# Patient Record
Sex: Female | Born: 1960 | Race: Asian | Hispanic: No | Marital: Single | Smoking: Never smoker
Health system: Southern US, Community
[De-identification: ages and names within clinical notes are randomized; demographics above are authoritative.]

## PROBLEM LIST (undated history)

## (undated) HISTORY — PX: OVARIAN CYST REMOVAL: SHX89

---

## 2018-11-18 ENCOUNTER — Ambulatory Visit (INDEPENDENT_AMBULATORY_CARE_PROVIDER_SITE_OTHER): Payer: BLUE CROSS/BLUE SHIELD

## 2018-11-18 ENCOUNTER — Ambulatory Visit
Admission: EM | Admit: 2018-11-18 | Discharge: 2018-11-18 | Disposition: A | Discharge status: Home / Self Care | Payer: BLUE CROSS/BLUE SHIELD | Attending: Family Medicine | Admitting: Family Medicine

## 2018-11-18 ENCOUNTER — Other Ambulatory Visit: Payer: Self-pay

## 2018-11-18 ENCOUNTER — Ambulatory Visit: Payer: BLUE CROSS/BLUE SHIELD

## 2018-11-18 ENCOUNTER — Encounter: Payer: Self-pay | Admitting: Emergency Medicine

## 2018-11-18 DIAGNOSIS — S0083XA Contusion of other part of head, initial encounter: Secondary | ICD-10-CM | POA: Diagnosis not present

## 2018-11-18 DIAGNOSIS — S46811A Strain of other muscles, fascia and tendons at shoulder and upper arm level, right arm, initial encounter: Secondary | ICD-10-CM

## 2018-11-18 DIAGNOSIS — S93401A Sprain of unspecified ligament of right ankle, initial encounter: Secondary | ICD-10-CM | POA: Diagnosis not present

## 2018-11-18 DIAGNOSIS — W0110XA Fall on same level from slipping, tripping and stumbling with subsequent striking against unspecified object, initial encounter: Secondary | ICD-10-CM | POA: Diagnosis not present

## 2018-11-18 MED ORDER — LIDOCAINE 5 % EX PTCH: 1.0000 | MEDICATED_PATCH | CUTANEOUS | 0 refills | Status: AC

## 2018-11-18 NOTE — ED Provider Notes (Signed)
[facility] ____________________________________________  Time seen: Approximately 4:29 PM  I have reviewed the triage vital signs and the nursing notes.   HISTORY  Chief Complaint Fall; Facial Pain; and Ankle Pain   HPI Vicki Riley is a 58 y.o. female presenting for evaluation of left facial pain and right ankle pain after fall that occurred just prior to arrival.  Patient states that she was carrying out her recycling, tripped and fell forward.  States she hit her left cheek on the edge of the recycling bin and rolled her right ankle.  States pain is mostly to left cheek at moderate.  Has applied ice.  Denies other alleviating measures.  Denies aggravating factors.  Denies loss of consciousness, neck or back pain, other extremity pain or injury.  Has continue remain ambulatory.  Denies headache, dizziness or vision changes.  Reports otherwise doing well, and denies other complaints.  States she fell only because she tripped.   History reviewed. No pertinent past medical history.  There are no active problems to display for this patient.   History reviewed. No pertinent surgical history.   No current facility-administered medications for this encounter.   Current Outpatient Medications:    lidocaine (LIDODERM) 5 %, Place 1 patch onto the skin daily. Remove & Discard patch within 12 hours., Disp: 5 patch, Rfl: 0  Allergies Patient has no known allergies.  History reviewed. No pertinent family history.  Social History Social History   Tobacco Use   Smoking status: Never Smoker   Smokeless tobacco: Never Used  Substance Use Topics   Alcohol use: Never    Frequency: Never   Drug use: Never    Review of Systems Constitutional: No fever Eyes: No visual changes. ENT: No sore throat. Cardiovascular: Denies chest pain. Respiratory: Denies shortness of breath. Gastrointestinal: No abdominal pain.  No nausea, no vomiting.   Musculoskeletal: Negative for  back pain. Skin: Negative for rash. Neurological: Negative for headaches, focal weakness or numbness.  ____________________________________________   PHYSICAL EXAM:  VITAL SIGNS: ED Triage Vitals  Enc Vitals Group     BP 11/18/18 1544 122/71     Pulse Rate 11/18/18 1544 66     Resp 11/18/18 1544 18     Temp 11/18/18 1544 98.4 F (36.9 C)     Temp Source 11/18/18 1544 Oral     SpO2 11/18/18 1544 97 %     Weight 11/18/18 1542 145 lb 8.1 oz (66 kg)     Height 11/18/18 1542 5\' 6"  (1.676 m)     Head Circumference --      Peak Flow --      Pain Score 11/18/18 1541 7     Pain Loc --      Pain Edu? --      Excl. in GC? --     Constitutional: Alert and oriented. Well appearing and in no acute distress. Eyes: Conjunctivae are normal. PERRL. EOMI. ENT      Head: Normocephalic.  Left cheek pain along the left maxilla with mild localized ecchymosis and swelling, minimal pain to left inferior orbit, able to fully open and close mouth.      Nose: No congestion.  Nontender.  No epistaxis.  Nares clear.      Mouth/Throat: Mucous membranes are moist.Oropharynx non-erythematous. Neck: No stridor. Supple without meningismus.  Hematological/Lymphatic/Immunilogical: No cervical lymphadenopathy. Cardiovascular: Normal rate, regular rhythm. Grossly normal heart sounds.  Good peripheral circulation. Respiratory: Normal respiratory effort without tachypnea nor retractions. Breath sounds are  clear and equal bilaterally. No wheezes, rales, rhonchi. Musculoskeletal:No midline cervical, thoracic or lumbar tenderness to palpation. Bilateral pedal pulses equal and easily palpated.   Except: Right lateral malleolus mild tenderness to direct palpation, minimal localized edema, no ecchymosis, full range of motion present, right lower extremity otherwise nontender.  Ambulatory with steady gait.  Right trapezius mild tenderness to direct palpation, no midline tenderness, no bony tenderness to right shoulder, full  range of motion present to right shoulder, no pain with cervical range of motion. Neurologic:  Normal speech and language. No gross focal neurologic deficits are appreciated. Speech is normal.  Skin:  Skin is warm, dry and intact. No rash noted. Psychiatric: Mood and affect are normal. Speech and behavior are normal. Patient exhibits appropriate insight and judgment   ___________________________________________   LABS (all labs ordered are listed, but only abnormal results are displayed)  Labs Reviewed - No data to display  RADIOLOGY  Dg Facial Bones Complete  Result Date: 11/18/2018 CLINICAL DATA:  Left axillary pain status post fall. EXAM: FACIAL BONES COMPLETE 3+V COMPARISON:  None. FINDINGS: There is no evidence of fracture or other significant bone abnormality. No orbital emphysema or sinus air-fluid levels are seen. IMPRESSION: No displaced fracture. Electronically Signed   By: Katherine Mantle M.D.   On: 11/18/2018 17:04   Dg Ankle Complete Right  Result Date: 11/18/2018 CLINICAL DATA:  Twisting injury with lateral ankle pain, initial encounter EXAM: RIGHT ANKLE - COMPLETE 3+ VIEW COMPARISON:  None. FINDINGS: There is no evidence of fracture, dislocation, or joint effusion. There is no evidence of arthropathy or other focal bone abnormality. Soft tissues are unremarkable. IMPRESSION: No acute abnormality noted. Electronically Signed   By: Alcide Clever M.D.   On: 11/18/2018 16:19   ____________________________________________   PROCEDURES Procedures    INITIAL IMPRESSION / ASSESSMENT AND PLAN / ED COURSE  Pertinent labs & imaging results that were available during my care of the patient were reviewed by me and considered in my medical decision making (see chart for details).  Well-appearing patient.  No acute distress.  Left facial pain and right ankle pain post mechanical injury, patient tripped and fell forward.  Discussed evaluation of right ankle x-ray as well as CT  maxillofacial.  However it was noted by her nursing staff that [facility] system to obtain prior Authorization was down, therefore unable to obtain prior Auth and unable to move forward with CT.  Will evaluate facial bone x-ray.  X-rays as above per radiologist.  Right ankle x-ray no acute abnormality noted.  Facial bones no displaced fracture noted per radiologist.  Suspect contusion to left face and right ankle sprain.  Patient also with right trapezius tenderness, suspect strain, patient states that she has had Lidoderm topical patches in the past for similar that worked well for her, and requests Rx, Rx given.  Supportive care, ice, stretch, over-the-counter Tylenol as needed.Discussed indication, risks and benefits of medications with patient.  Discussed follow up with Primary care physician this week.  Discussed follow-up with PCP or ENT for continued facial pain.  Discussed follow up and return parameters including no resolution or any worsening concerns. Patient verbalized understanding and agreed to plan.   ____________________________________________   FINAL CLINICAL IMPRESSION(S) / ED DIAGNOSES  Final diagnoses:  Contusion of face, initial encounter  Sprain of right ankle, unspecified ligament, initial encounter  Strain of right trapezius muscle, initial encounter     ED Discharge Orders  Ordered    lidocaine (LIDODERM) 5 %  Every 24 hours     11/18/18 1716           Note: This dictation was prepared with Dragon dictation along with smaller phrase technology. Any transcriptional errors that result from this process are unintentional.         Renford Dills, NP 11/18/18 1751

## 2018-11-18 NOTE — Discharge Instructions (Addendum)
Take medication as prescribed. Rest. Drink plenty of fluids.  Over-the-counter Tylenol as needed.  Ice.  Gradual increase activity as tolerated.  Follow up with your primary care physician or ear nose and throat for continued pain.Return to Urgent care for new or worsening concerns.

## 2018-11-18 NOTE — ED Triage Notes (Signed)
Patient c/o fall today. She was working in the yard and fell hitting her face on the recycle bin. She also states she injured her right ankle when she fell.

## 2019-02-16 ENCOUNTER — Ambulatory Visit
Admission: EM | Admit: 2019-02-16 | Discharge: 2019-02-16 | Disposition: A | Discharge status: Home / Self Care | Payer: BLUE CROSS/BLUE SHIELD | Attending: Family Medicine | Admitting: Family Medicine

## 2019-02-16 ENCOUNTER — Other Ambulatory Visit: Payer: Self-pay

## 2019-02-16 DIAGNOSIS — L509 Urticaria, unspecified: Secondary | ICD-10-CM | POA: Diagnosis not present

## 2019-02-16 MED ORDER — FAMOTIDINE 20 MG PO TABS
20.0000 mg | ORAL_TABLET | Freq: Once | ORAL | Status: AC
  Administered 2019-02-16: 13:00:00 20 mg via ORAL

## 2019-02-16 MED ORDER — EPINEPHRINE 0.3 MG/0.3ML IJ SOAJ: 0.3000 mg | INTRAMUSCULAR | 1 refills | Status: AC | PRN

## 2019-02-16 MED ORDER — PREDNISONE 10 MG PO TABS: ORAL_TABLET | ORAL | 0 refills | Status: AC

## 2019-02-16 MED ORDER — METHYLPREDNISOLONE SODIUM SUCC 125 MG IJ SOLR
125.0000 mg | Freq: Once | INTRAMUSCULAR | Status: AC
  Administered 2019-02-16: 13:00:00 125 mg via INTRAMUSCULAR

## 2019-02-16 NOTE — Discharge Instructions (Signed)
Over the counter anti-histamines and pepcid

## 2019-02-16 NOTE — ED Triage Notes (Signed)
Rash started today and has been spreading up her legs and now a few spots on her arms. Unsure what the causative agent is. In the past she has had allergic reactions and needed to come to urgent care

## 2019-02-16 NOTE — ED Provider Notes (Signed)
[facility]    CSN: [id number] Arrival date & time: 02/16/19  1144     History   Chief Complaint Chief Complaint  Patient presents with  . Rash    HPI Vicki Riley is a 58 y.o. female.   58 yo female with a c/o a rash that started today. States rash is like hives and is itchy. States she's had similar rash in the past when exposed to metals, however states she's not sure what triggered the rash this time. Denies any facial or throat swelling, shortness of breath, wheezing, chest pains. States rash is better (decreased) now compared to what is was earlier in the day. States that she can not take benadryl as it causes a reaction in her (vomit).    Rash   History reviewed. No pertinent past medical history.  There are no active problems to display for this patient.   Past Surgical History:  Procedure Laterality Date  . OVARIAN CYST REMOVAL      OB History   No obstetric history on file.      Home Medications    Prior to Admission medications   Medication Sig Start Date End Date Taking? Authorizing Provider  EPINEPHrine 0.3 mg/0.3 mL IJ SOAJ injection Inject 0.3 mLs (0.3 mg total) into the muscle as needed for anaphylaxis. 02/16/19   Payton Mccallumonty, Moody Robben, MD  lidocaine (LIDODERM) 5 % Place 1 patch onto the skin daily. Remove & Discard patch within 12 hours. 11/18/18   Renford DillsMiller, Lindsey, NP  predniSONE (DELTASONE) 10 MG tablet Start 60 mg po day one, then 50 mg po day two, taper by 10 mg daily until complete. 02/16/19   Payton Mccallumonty, Ravenne Wayment, MD    Family History History reviewed. No pertinent family history.  Social History Social History   Tobacco Use  . Smoking status: Never Smoker  . Smokeless tobacco: Never Used  Substance Use Topics  . Alcohol use: Yes    Frequency: Never    Comment: wine twice weekly  . Drug use: Never     Allergies   Silver, Tramadol, Latex, Nickel, and Oxycodone-acetaminophen   Review of Systems Review of Systems  Skin: Positive  for rash.     Physical Exam Triage Vital Signs ED Triage Vitals  Enc Vitals Group     BP 02/16/19 1247 137/84     Pulse Rate 02/16/19 1247 78     Resp 02/16/19 1247 16     Temp 02/16/19 1247 98.2 F (36.8 C)     Temp Source 02/16/19 1247 Oral     SpO2 02/16/19 1247 96 %     Weight 02/16/19 1249 146 lb (66.2 kg)     Height 02/16/19 1249 5\' 6"  (1.676 m)     Head Circumference --      Peak Flow --      Pain Score 02/16/19 1249 0     Pain Loc --      Pain Edu? --      Excl. in GC? --    No data found.  Updated Vital Signs BP 137/84 (BP Location: Left Arm)   Pulse 78   Temp 98.2 F (36.8 C) (Oral)   Resp 16   Ht 5\' 6"  (1.676 m)   Wt 66.2 kg   SpO2 96%   BMI 23.57 kg/m   Visual Acuity Right Eye Distance:   Left Eye Distance:   Bilateral Distance:    Right Eye Near:   Left Eye Near:    Bilateral  Near:     Physical Exam Vitals signs and nursing note reviewed.  Constitutional:      General: She is not in acute distress.    Appearance: Normal appearance. She is not ill-appearing, toxic-appearing or diaphoretic.  HENT:     Mouth/Throat:     Mouth: No oral lesions or angioedema.     Pharynx: Oropharynx is clear. Uvula midline. No pharyngeal swelling, oropharyngeal exudate, posterior oropharyngeal erythema or uvula swelling.     Tonsils: No tonsillar exudate or tonsillar abscesses.  Cardiovascular:     Rate and Rhythm: Normal rate and regular rhythm.  Pulmonary:     Effort: Pulmonary effort is normal. No respiratory distress.     Breath sounds: Normal breath sounds. No stridor. No wheezing.  Skin:    Findings: Rash present. Rash is urticarial (on legs and arms mild).  Neurological:     Mental Status: She is alert.      UC Treatments / Results  Labs (all labs ordered are listed, but only abnormal results are displayed) Labs Reviewed - No data to display  EKG   Radiology No results found.  Procedures Procedures (including critical care time)   Medications Ordered in UC Medications  methylPREDNISolone sodium succinate (SOLU-MEDROL) 125 mg/2 mL injection 125 mg (125 mg Intramuscular Given 02/16/19 1308)  famotidine (PEPCID) tablet 20 mg (20 mg Oral Given 02/16/19 1308)    Initial Impression / Assessment and Plan / UC Course  I have reviewed the triage vital signs and the nursing notes.  Pertinent labs & imaging results that were available during my care of the patient were reviewed by me and considered in my medical decision making (see chart for details).      Final Clinical Impressions(s) / UC Diagnoses   Final diagnoses:  Urticaria     Discharge Instructions     Over the counter anti-histamines and pepcid    ED Prescriptions    Medication Sig Dispense Auth. Provider   predniSONE (DELTASONE) 10 MG tablet Start 60 mg po day one, then 50 mg po day two, taper by 10 mg daily until complete. 21 tablet Nesha Counihan, Linward Foster, MD   EPINEPHrine 0.3 mg/0.3 mL IJ SOAJ injection Inject 0.3 mLs (0.3 mg total) into the muscle as needed for anaphylaxis. 1 each Norval Gable, MD      1. diagnosis reviewed with patient; given solumedrol 125mg  IM x 1 plus pepcid 20mg  po x 1 2. rx as per orders above; reviewed possible side effects, interactions, risks and benefits  3. Recommend supportive treatment with otc antihistamine 4. Follow-up prn if symptoms worsen or don't improve  Controlled Substance Prescriptions  Controlled Substance Registry consulted? Not Applicable   Norval Gable, MD 02/16/19 [phone]

## 2020-01-08 IMAGING — CR FACIAL BONES COMPLETE 3+V
5 series · 6 of 6 positions shown · non-contrast
Comparison: None.

CLINICAL DATA: Left axillary pain status post fall.

EXAM:
FACIAL BONES COMPLETE 3+V

[facial waters]
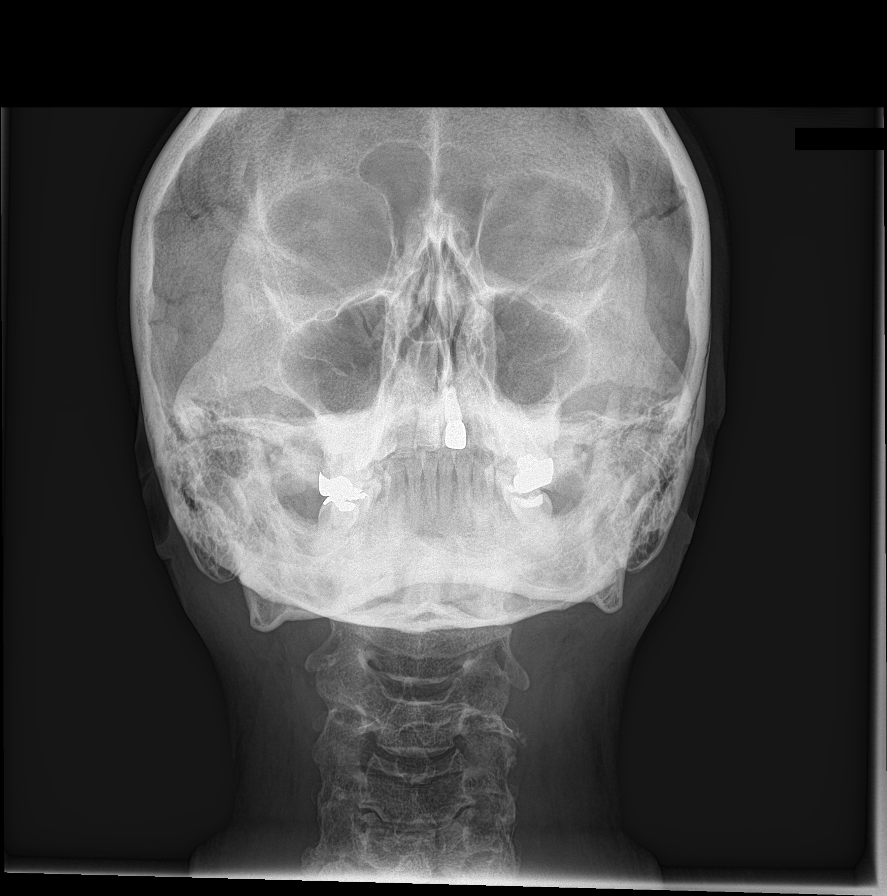

[facial pa]
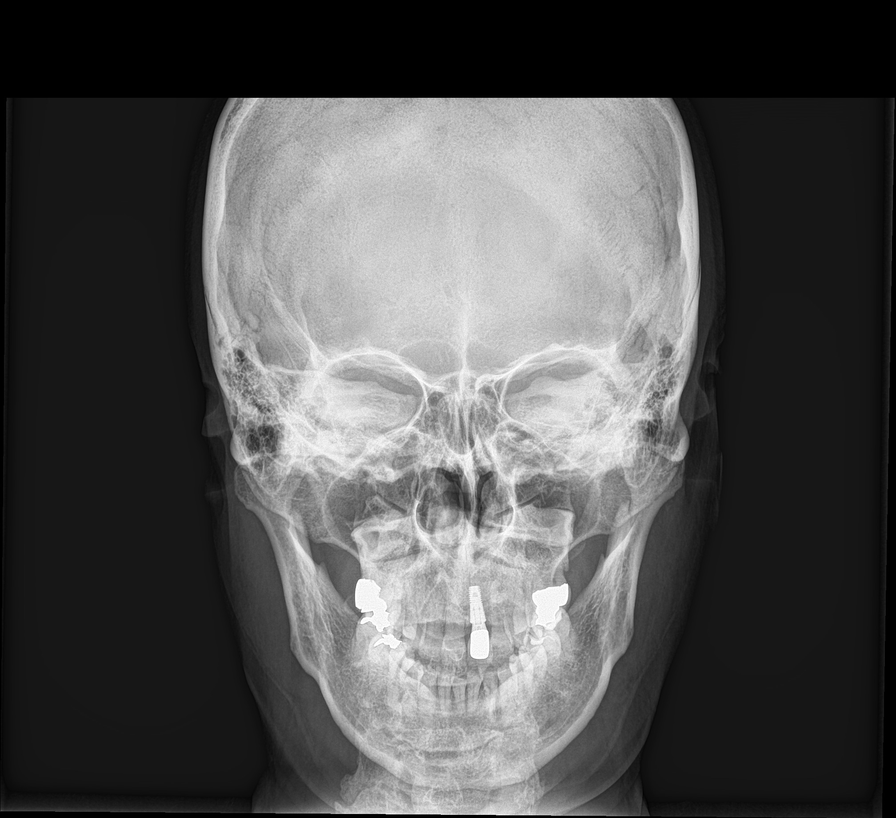

[Series 3: facial lat · 0.14mm/px · 2 of 2 slices shown]
[im 1/2]
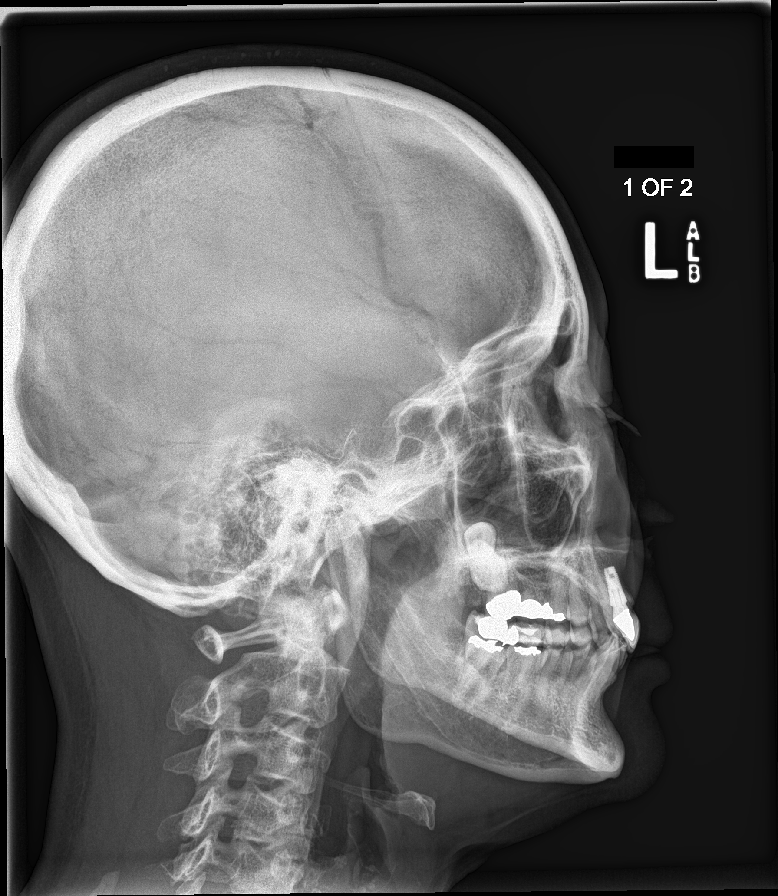
[im 2/2]
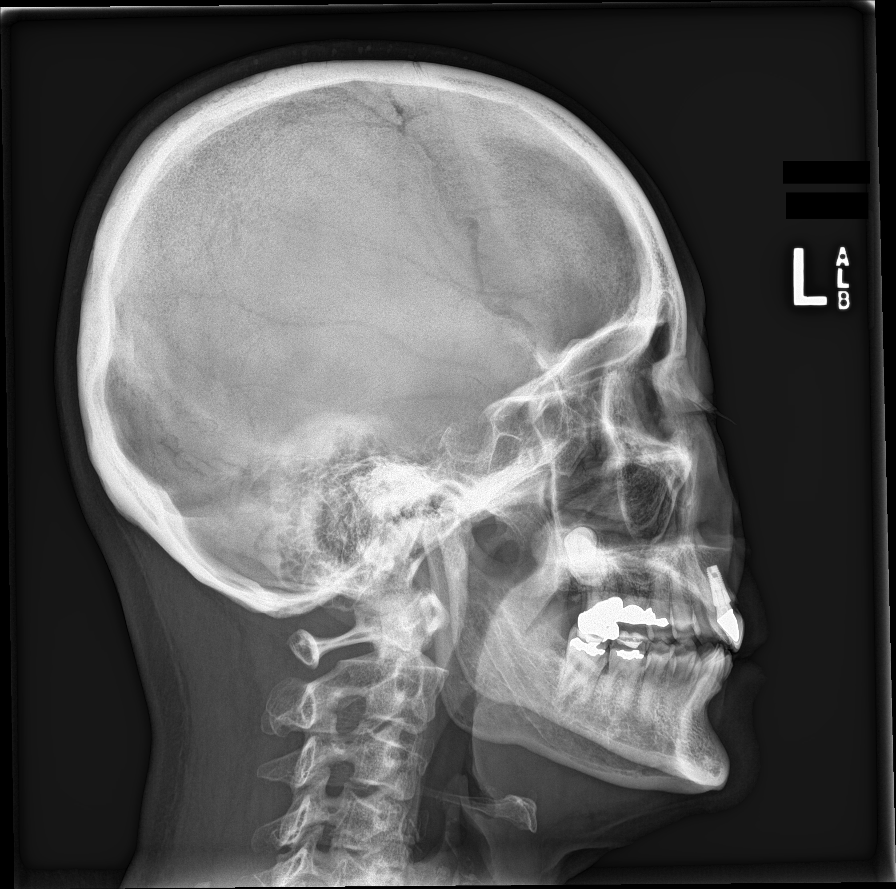

[skull [person_name]]
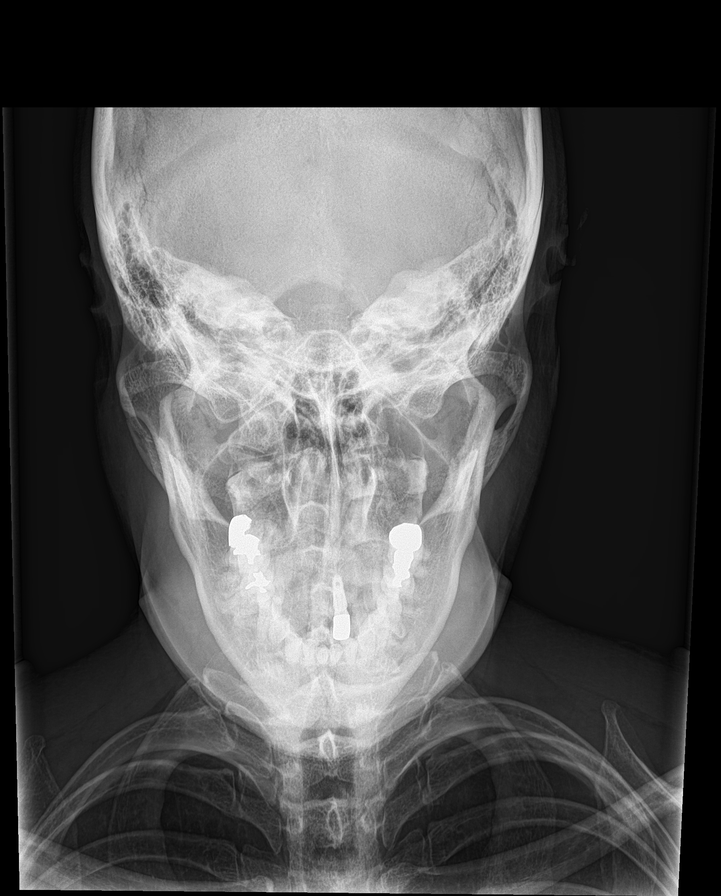

[skull smv]
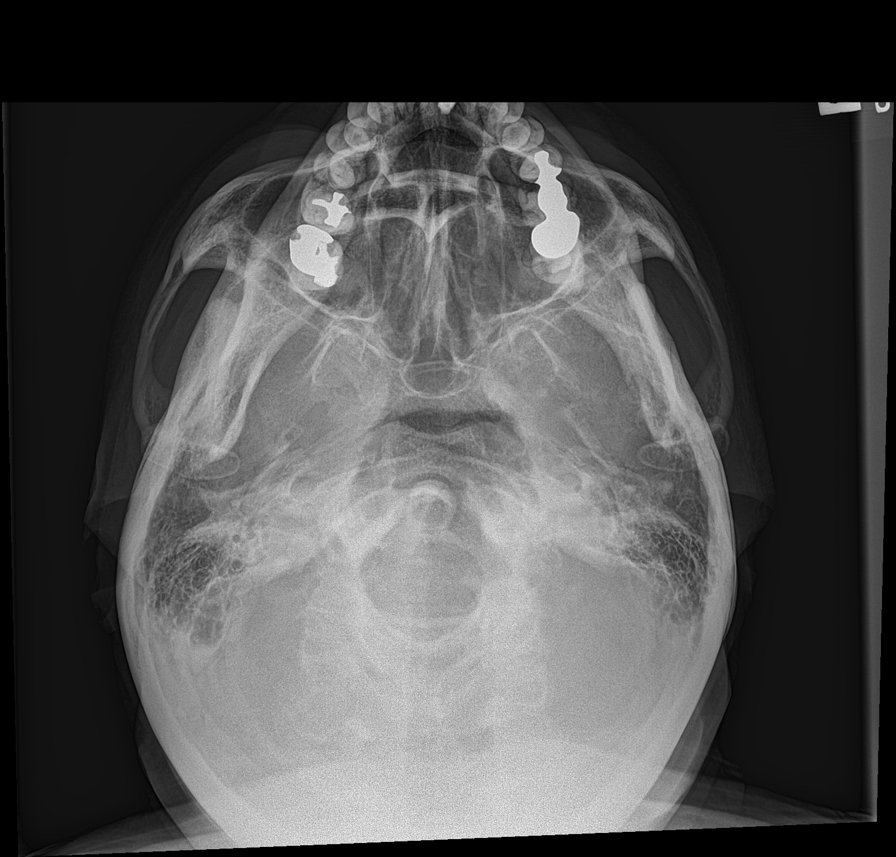

[6 of 6 positions shown; findings below may reference images not displayed]

FINDINGS: There is no evidence of fracture or other significant bone
abnormality. No orbital emphysema or sinus air-fluid levels are
seen.
IMPRESSION: No displaced fracture.
